# Patient Record
Sex: Male | Born: 1980 | Race: White | Hispanic: No | Marital: Married | Smoking: Former smoker
Health system: Southern US, Community
[De-identification: ages and names within clinical notes are randomized; demographics above are authoritative.]

## PROBLEM LIST (undated history)

## (undated) ENCOUNTER — Emergency Department (HOSPITAL_COMMUNITY): Disposition: A | Discharge status: Home / Self Care | Payer: Self-pay

## (undated) HISTORY — PX: ADENOIDECTOMY: SUR15

## (undated) HISTORY — PX: TYMPANOSTOMY TUBE PLACEMENT: SHX32

## (undated) HISTORY — PX: WISDOM TOOTH EXTRACTION: SHX21

---
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
## 2011-04-16 ENCOUNTER — Ambulatory Visit (INDEPENDENT_AMBULATORY_CARE_PROVIDER_SITE_OTHER): Payer: Self-pay | Admitting: General Surgery

## 2011-04-29 ENCOUNTER — Encounter (INDEPENDENT_AMBULATORY_CARE_PROVIDER_SITE_OTHER): Payer: Self-pay | Admitting: General Surgery

## 2011-04-30 ENCOUNTER — Ambulatory Visit (INDEPENDENT_AMBULATORY_CARE_PROVIDER_SITE_OTHER): Payer: 59 | Admitting: General Surgery

## 2011-04-30 ENCOUNTER — Encounter (INDEPENDENT_AMBULATORY_CARE_PROVIDER_SITE_OTHER): Payer: Self-pay | Admitting: General Surgery

## 2011-04-30 DIAGNOSIS — L0591 Pilonidal cyst without abscess: Secondary | ICD-10-CM | POA: Insufficient documentation

## 2011-04-30 NOTE — Progress Notes (Signed)
Subjective:     Patient ID: Greg Lee, male   DOB: 19-Apr-1980, 31 y.o.   MRN: [id number]  HPI We are asked to see the patient in consultation by Dr. Danelle Earthly Redmon to evaluate him for a pilonidal cyst. The patient is a 31 year old white male who recently developed a swollen tender area near the top of his gluteal cleft region. He went to see his medical doctor who put him on antibiotics. The swelling quickly resolved. He only has some minor tenderness when he leans back and a hard chair at this time. He denies any fever or drainage. This is the first time he hasn't noticed this  Review of Systems  Constitutional: Negative.   HENT: Negative.   Eyes: Negative.   Respiratory: Negative.   Cardiovascular: Negative.   Gastrointestinal: Negative.   Genitourinary: Negative.   Musculoskeletal: Negative.   Skin: Negative.   Neurological: Negative.   Hematological: Negative.   Psychiatric/Behavioral: Negative.        Objective:   Physical Exam  Constitutional: He is oriented to person, place, and time. He appears well-developed and well-nourished.  HENT:  Head: Normocephalic and atraumatic.  Eyes: Conjunctivae and EOM are normal. Pupils are equal, round, and reactive to light.  Neck: Normal range of motion. Neck supple.  Cardiovascular: Normal rate, regular rhythm and normal heart sounds.   Pulmonary/Chest: Effort normal and breath sounds normal.  Abdominal: Soft. Bowel sounds are normal.  Genitourinary:       The patient has a couple of small punctate holes in his gluteal cleft region. No active acute infection today  Musculoskeletal: Normal range of motion.  Neurological: He is alert and oriented to person, place, and time.  Skin: Skin is dry.  Psychiatric: He has a normal mood and affect. His behavior is normal.       Assessment:     The patient has a pilonidal cyst that may have been recently infected.    Plan:     In order to keep this from continuing to recur I think he  would benefit from having this area incised and drained. I have discussed with him in detail the risks and benefits of the operation to this as well some of the technical aspects and he understands and wishes to proceed. He will call back to schedule when it works for him

## 2011-04-30 NOTE — Patient Instructions (Signed)
Plan for incision and drainage of pilonidal cyst

## 2014-02-01 ENCOUNTER — Emergency Department
Admission: EM | Admit: 2014-02-01 | Discharge: 2014-02-01 | Disposition: A | Discharge status: Home / Self Care | Payer: 59 | Source: Home / Self Care | Attending: Physician Assistant | Admitting: Physician Assistant

## 2014-02-01 ENCOUNTER — Encounter: Payer: Self-pay | Admitting: *Deleted

## 2014-02-01 DIAGNOSIS — S233XXA Sprain of ligaments of thoracic spine, initial encounter: Secondary | ICD-10-CM

## 2014-02-01 DIAGNOSIS — S335XXA Sprain of ligaments of lumbar spine, initial encounter: Secondary | ICD-10-CM

## 2014-02-01 DIAGNOSIS — IMO0001 Reserved for inherently not codable concepts without codable children: Secondary | ICD-10-CM

## 2014-02-01 DIAGNOSIS — R030 Elevated blood-pressure reading, without diagnosis of hypertension: Secondary | ICD-10-CM

## 2014-02-01 MED ORDER — CYCLOBENZAPRINE HCL 10 MG PO TABS: ORAL_TABLET | ORAL | Status: DC

## 2014-02-01 MED ORDER — HYDROCODONE-ACETAMINOPHEN 5-325 MG PO TABS: 1.0000 | ORAL_TABLET | Freq: Three times a day (TID) | ORAL | Status: DC | PRN

## 2014-02-01 MED ORDER — MELOXICAM 15 MG PO TABS: ORAL_TABLET | ORAL | Status: DC

## 2014-02-01 NOTE — Discharge Instructions (Signed)
Alternate ice and heat.   Back Exercises These exercises may help you when beginning to rehabilitate your injury. Your symptoms may resolve with or without further involvement from your physician, physical therapist or athletic trainer. While completing these exercises, remember:   Restoring tissue flexibility helps normal motion to return to the joints. This allows healthier, less painful movement and activity.  An effective stretch should be held for at least 30 seconds.  A stretch should never be painful. You should only feel a gentle lengthening or release in the stretched tissue. STRETCH - Extension, Prone on Elbows   Lie on your stomach on the floor, a bed will be too soft. Place your palms about shoulder width apart and at the height of your head.  Place your elbows under your shoulders. If this is too painful, stack pillows under your chest.  Allow your body to relax so that your hips drop lower and make contact more completely with the floor.  Hold this position for __________ seconds.  Slowly return to lying flat on the floor. Repeat __________ times. Complete this exercise __________ times per day.  RANGE OF MOTION - Extension, Prone Press Ups   Lie on your stomach on the floor, a bed will be too soft. Place your palms about shoulder width apart and at the height of your head.  Keeping your back as relaxed as possible, slowly straighten your elbows while keeping your hips on the floor. You may adjust the placement of your hands to maximize your comfort. As you gain motion, your hands will come more underneath your shoulders.  Hold this position __________ seconds.  Slowly return to lying flat on the floor. Repeat __________ times. Complete this exercise __________ times per day.  RANGE OF MOTION- Quadruped, Neutral Spine   Assume a hands and knees position on a firm surface. Keep your hands under your shoulders and your knees under your hips. You may place padding under  your knees for comfort.  Drop your head and point your tail bone toward the ground below you. This will round out your low back like an angry cat. Hold this position for __________ seconds.  Slowly lift your head and release your tail bone so that your back sags into a large arch, like an old horse.  Hold this position for __________ seconds.  Repeat this until you feel limber in your low back.  Now, find your "sweet spot." This will be the most comfortable position somewhere between the two previous positions. This is your neutral spine. Once you have found this position, tense your stomach muscles to support your low back.  Hold this position for __________ seconds. Repeat __________ times. Complete this exercise __________ times per day.  STRETCH - Flexion, Single Knee to Chest   Lie on a firm bed or floor with both legs extended in front of you.  Keeping one leg in contact with the floor, bring your opposite knee to your chest. Hold your leg in place by either grabbing behind your thigh or at your knee.  Pull until you feel a gentle stretch in your low back. Hold __________ seconds.  Slowly release your grasp and repeat the exercise with the opposite side. Repeat __________ times. Complete this exercise __________ times per day.  STRETCH - Hamstrings, Standing  Stand or sit and extend your right / left leg, placing your foot on a chair or foot stool  Keeping a slight arch in your low back and your hips straight forward.  Lead with your chest and lean forward at the waist until you feel a gentle stretch in the back of your right / left knee or thigh. (When done correctly, this exercise requires leaning only a small distance.)  Hold this position for __________ seconds. Repeat __________ times. Complete this stretch __________ times per day. STRENGTHENING - Deep Abdominals, Pelvic Tilt   Lie on a firm bed or floor. Keeping your legs in front of you, bend your knees so they are  both pointed toward the ceiling and your feet are flat on the floor.  Tense your lower abdominal muscles to press your low back into the floor. This motion will rotate your pelvis so that your tail bone is scooping upwards rather than pointing at your feet or into the floor.  With a gentle tension and even breathing, hold this position for __________ seconds. Repeat __________ times. Complete this exercise __________ times per day.  STRENGTHENING - Abdominals, Crunches   Lie on a firm bed or floor. Keeping your legs in front of you, bend your knees so they are both pointed toward the ceiling and your feet are flat on the floor. Cross your arms over your chest.  Slightly tip your chin down without bending your neck.  Tense your abdominals and slowly lift your trunk high enough to just clear your shoulder blades. Lifting higher can put excessive stress on the low back and does not further strengthen your abdominal muscles.  Control your return to the starting position. Repeat __________ times. Complete this exercise __________ times per day.  STRENGTHENING - Quadruped, Opposite UE/LE Lift   Assume a hands and knees position on a firm surface. Keep your hands under your shoulders and your knees under your hips. You may place padding under your knees for comfort.  Find your neutral spine and gently tense your abdominal muscles so that you can maintain this position. Your shoulders and hips should form a rectangle that is parallel with the floor and is not twisted.  Keeping your trunk steady, lift your right hand no higher than your shoulder and then your left leg no higher than your hip. Make sure you are not holding your breath. Hold this position __________ seconds.  Continuing to keep your abdominal muscles tense and your back steady, slowly return to your starting position. Repeat with the opposite arm and leg. Repeat __________ times. Complete this exercise __________ times per  day. Document Released: 03/29/2005 Document Revised: 06/03/2011 Document Reviewed: 06/23/2008 [facility] Patient Information 2015 [employer] [place], [place]. This information is not intended to replace advice given to you by your health care provider. Make sure you discuss any questions you have with your health care provider.

## 2014-02-01 NOTE — ED Provider Notes (Signed)
CSN: [id number]     Arrival date & time 02/01/14  1722 History   First MD Initiated Contact with Patient 02/01/14 1731     Chief Complaint  Patient presents with  . Back Pain   (Consider location/radiation/quality/duration/timing/severity/associated sxs/prior Treatment) HPI  Pt is a 33 yo male who presents to the clinic with mid to low left sided back pain that started this morning after lifting weights. Has continued to get worse throughout the day. Pain currently 7-8/10. Sitting still no pain. He admits to doing a new overhead lift this morning with 85lb weights. Not taken anything to make better. All movement causes pain.   Denies any bowel or bladder dysfunction or saddle anesthesia.   Past Medical History  Diagnosis Date  . Pilonidal cyst    Past Surgical History  Procedure Laterality Date  . Adenoidectomy    . Wisdom tooth extraction    . Tympanostomy tube placement     Family History  Problem Relation Age of Onset  . Cancer Maternal Grandmother     colon  . Heart disease Maternal Grandfather   . Hypertension Mother   . Hypertension Father   . Diabetes Father    History  Substance Use Topics  . Smoking status: Former Smoker -- 0.00 packs/day  . Smokeless tobacco: Not on file  . Alcohol Use: Yes     Comment: 1 q wk    Review of Systems  All other systems reviewed and are negative.   Allergies  Review of patient's allergies indicates no known allergies.  Home Medications   Prior to Admission medications   Medication Sig Start Date End Date Taking? Authorizing Provider  cyclobenzaprine (FLEXERIL) 10 MG tablet Start one tablet at bedtime and can increase up to three times a day. 02/01/14   Delayne Sanzo L Zannie Locastro, PA-C  HYDROcodone-acetaminophen (NORCO/VICODIN) 5-325 MG per tablet Take 1 tablet by mouth every 8 (eight) hours as needed for moderate pain. 02/01/14   Omarion Minnehan L Laticia Vannostrand, PA-C  meloxicam (MOBIC) 15 MG tablet One tab PO qAM with breakfast for 2 weeks, then  daily prn pain. 02/01/14   Zaydon Kinser L Ervin Rothbauer, PA-C  Multiple Vitamins-Minerals (ALIVE MENS ENERGY PO) Take by mouth daily.    Historical Provider, MD   BP 170/104 mmHg  Pulse 100  Temp(Src) 98.1 F (36.7 C) (Oral)  Resp 18  Ht 5\' 9"  (1.753 m)  Wt 225 lb (102.059 kg)  BMI 33.21 kg/m2  SpO2 98% Physical Exam  Constitutional: He appears well-developed and well-nourished.  Musculoskeletal:  Left sided Paraspinous muscles are tight to palpation. With deep palpation there is mild pain but pt is a bigger guy and harder to palpate muscles.  Pain comes with lateral movements at waist, deep breathing, bending over.  Pt has no ROM at waist due to pain.  Pain seems to concentrate in the rhomboid and latissimus dorsi on left side.  No pain to palpation over spine.      ED Course  Procedures (including critical care time) Labs Review Labs Reviewed - No data to display  Imaging Review No results found.   MDM   1. Elevated blood pressure   2. Sprain of upper back, initial encounter   3. Sprain of low back, initial encounter    Discussed BP elevation could be due to pain but suggest to have follow BP check with PCP.   Discussed with patient symptoms are very consistent with muscle spasms of mid to low left side of back.  Offered  shot of toradol pt declined shot.  I do not think imaging is warranted at this time.  mobic given to take every morning with breakfast.  Flexeril was given to use up to 3 times a day. Sedation warning given.  norco for acute pain.  Exercises to start.  Encouraged ice 15min at a time alternated with heat.  Discussed can use topical and consider massage.  Written out of work for shift Wednesday and Thursday. He has to wear 25lbs belt and do a lot of lifting on his job.  Discussed follow up with PCP if not improving in next week.  Encouraged pt to keep moving do not stay stiff.     Jomarie LongsJade L Foch Rosenwald, PA-C 02/01/14 1920

## 2014-02-01 NOTE — ED Notes (Signed)
Pt c/o LT side mid back pain x this morning while lifting weights. No OTC meds.

## 2016-05-15 DIAGNOSIS — M5136 Other intervertebral disc degeneration, lumbar region: Secondary | ICD-10-CM | POA: Diagnosis not present

## 2016-05-15 DIAGNOSIS — M9903 Segmental and somatic dysfunction of lumbar region: Secondary | ICD-10-CM | POA: Diagnosis not present

## 2016-05-15 DIAGNOSIS — M5441 Lumbago with sciatica, right side: Secondary | ICD-10-CM | POA: Diagnosis not present

## 2016-05-20 DIAGNOSIS — M9903 Segmental and somatic dysfunction of lumbar region: Secondary | ICD-10-CM | POA: Diagnosis not present

## 2016-05-20 DIAGNOSIS — M5136 Other intervertebral disc degeneration, lumbar region: Secondary | ICD-10-CM | POA: Diagnosis not present

## 2016-05-20 DIAGNOSIS — M5441 Lumbago with sciatica, right side: Secondary | ICD-10-CM | POA: Diagnosis not present

## 2016-05-21 DIAGNOSIS — M5441 Lumbago with sciatica, right side: Secondary | ICD-10-CM | POA: Diagnosis not present

## 2016-05-21 DIAGNOSIS — M5136 Other intervertebral disc degeneration, lumbar region: Secondary | ICD-10-CM | POA: Diagnosis not present

## 2016-05-21 DIAGNOSIS — M9903 Segmental and somatic dysfunction of lumbar region: Secondary | ICD-10-CM | POA: Diagnosis not present

## 2016-05-23 DIAGNOSIS — M5136 Other intervertebral disc degeneration, lumbar region: Secondary | ICD-10-CM | POA: Diagnosis not present

## 2016-05-23 DIAGNOSIS — M9903 Segmental and somatic dysfunction of lumbar region: Secondary | ICD-10-CM | POA: Diagnosis not present

## 2016-05-23 DIAGNOSIS — M5441 Lumbago with sciatica, right side: Secondary | ICD-10-CM | POA: Diagnosis not present

## 2016-05-27 DIAGNOSIS — M5441 Lumbago with sciatica, right side: Secondary | ICD-10-CM | POA: Diagnosis not present

## 2016-05-27 DIAGNOSIS — M5136 Other intervertebral disc degeneration, lumbar region: Secondary | ICD-10-CM | POA: Diagnosis not present

## 2016-05-27 DIAGNOSIS — M9903 Segmental and somatic dysfunction of lumbar region: Secondary | ICD-10-CM | POA: Diagnosis not present

## 2016-05-28 DIAGNOSIS — M9903 Segmental and somatic dysfunction of lumbar region: Secondary | ICD-10-CM | POA: Diagnosis not present

## 2016-05-28 DIAGNOSIS — M5136 Other intervertebral disc degeneration, lumbar region: Secondary | ICD-10-CM | POA: Diagnosis not present

## 2016-05-28 DIAGNOSIS — M5441 Lumbago with sciatica, right side: Secondary | ICD-10-CM | POA: Diagnosis not present

## 2016-05-30 DIAGNOSIS — M5441 Lumbago with sciatica, right side: Secondary | ICD-10-CM | POA: Diagnosis not present

## 2016-05-30 DIAGNOSIS — M5136 Other intervertebral disc degeneration, lumbar region: Secondary | ICD-10-CM | POA: Diagnosis not present

## 2016-05-30 DIAGNOSIS — M9903 Segmental and somatic dysfunction of lumbar region: Secondary | ICD-10-CM | POA: Diagnosis not present

## 2016-06-04 DIAGNOSIS — M5136 Other intervertebral disc degeneration, lumbar region: Secondary | ICD-10-CM | POA: Diagnosis not present

## 2016-06-04 DIAGNOSIS — M5441 Lumbago with sciatica, right side: Secondary | ICD-10-CM | POA: Diagnosis not present

## 2016-06-04 DIAGNOSIS — M9903 Segmental and somatic dysfunction of lumbar region: Secondary | ICD-10-CM | POA: Diagnosis not present

## 2016-06-05 DIAGNOSIS — M9903 Segmental and somatic dysfunction of lumbar region: Secondary | ICD-10-CM | POA: Diagnosis not present

## 2016-06-05 DIAGNOSIS — M5136 Other intervertebral disc degeneration, lumbar region: Secondary | ICD-10-CM | POA: Diagnosis not present

## 2016-06-05 DIAGNOSIS — M5441 Lumbago with sciatica, right side: Secondary | ICD-10-CM | POA: Diagnosis not present

## 2016-06-06 DIAGNOSIS — M9903 Segmental and somatic dysfunction of lumbar region: Secondary | ICD-10-CM | POA: Diagnosis not present

## 2016-06-06 DIAGNOSIS — M5136 Other intervertebral disc degeneration, lumbar region: Secondary | ICD-10-CM | POA: Diagnosis not present

## 2016-06-06 DIAGNOSIS — M5441 Lumbago with sciatica, right side: Secondary | ICD-10-CM | POA: Diagnosis not present

## 2016-06-13 DIAGNOSIS — M9903 Segmental and somatic dysfunction of lumbar region: Secondary | ICD-10-CM | POA: Diagnosis not present

## 2016-06-13 DIAGNOSIS — M5136 Other intervertebral disc degeneration, lumbar region: Secondary | ICD-10-CM | POA: Diagnosis not present

## 2016-06-13 DIAGNOSIS — M5441 Lumbago with sciatica, right side: Secondary | ICD-10-CM | POA: Diagnosis not present

## 2016-06-17 DIAGNOSIS — M5136 Other intervertebral disc degeneration, lumbar region: Secondary | ICD-10-CM | POA: Diagnosis not present

## 2016-06-17 DIAGNOSIS — M5441 Lumbago with sciatica, right side: Secondary | ICD-10-CM | POA: Diagnosis not present

## 2016-06-17 DIAGNOSIS — M9903 Segmental and somatic dysfunction of lumbar region: Secondary | ICD-10-CM | POA: Diagnosis not present

## 2016-06-25 DIAGNOSIS — M9903 Segmental and somatic dysfunction of lumbar region: Secondary | ICD-10-CM | POA: Diagnosis not present

## 2016-06-25 DIAGNOSIS — M5441 Lumbago with sciatica, right side: Secondary | ICD-10-CM | POA: Diagnosis not present

## 2016-06-25 DIAGNOSIS — M5136 Other intervertebral disc degeneration, lumbar region: Secondary | ICD-10-CM | POA: Diagnosis not present

## 2017-05-20 DIAGNOSIS — M9903 Segmental and somatic dysfunction of lumbar region: Secondary | ICD-10-CM | POA: Diagnosis not present

## 2017-05-26 DIAGNOSIS — M9903 Segmental and somatic dysfunction of lumbar region: Secondary | ICD-10-CM | POA: Diagnosis not present

## 2017-06-02 DIAGNOSIS — M9903 Segmental and somatic dysfunction of lumbar region: Secondary | ICD-10-CM | POA: Diagnosis not present

## 2017-06-09 DIAGNOSIS — M9903 Segmental and somatic dysfunction of lumbar region: Secondary | ICD-10-CM | POA: Diagnosis not present

## 2017-06-16 DIAGNOSIS — M9903 Segmental and somatic dysfunction of lumbar region: Secondary | ICD-10-CM | POA: Diagnosis not present

## 2017-06-23 DIAGNOSIS — M9903 Segmental and somatic dysfunction of lumbar region: Secondary | ICD-10-CM | POA: Diagnosis not present

## 2017-07-10 DIAGNOSIS — M9903 Segmental and somatic dysfunction of lumbar region: Secondary | ICD-10-CM | POA: Diagnosis not present

## 2017-07-29 DIAGNOSIS — M9903 Segmental and somatic dysfunction of lumbar region: Secondary | ICD-10-CM | POA: Diagnosis not present

## 2017-08-12 DIAGNOSIS — M9903 Segmental and somatic dysfunction of lumbar region: Secondary | ICD-10-CM | POA: Diagnosis not present

## 2018-04-20 DIAGNOSIS — M79641 Pain in right hand: Secondary | ICD-10-CM | POA: Diagnosis not present

## 2018-04-20 DIAGNOSIS — S62612A Displaced fracture of proximal phalanx of right middle finger, initial encounter for closed fracture: Secondary | ICD-10-CM | POA: Diagnosis not present

## 2018-04-27 DIAGNOSIS — S62612A Displaced fracture of proximal phalanx of right middle finger, initial encounter for closed fracture: Secondary | ICD-10-CM | POA: Diagnosis not present

## 2018-05-11 DIAGNOSIS — S62612D Displaced fracture of proximal phalanx of right middle finger, subsequent encounter for fracture with routine healing: Secondary | ICD-10-CM | POA: Diagnosis not present

## 2018-06-01 DIAGNOSIS — S62612D Displaced fracture of proximal phalanx of right middle finger, subsequent encounter for fracture with routine healing: Secondary | ICD-10-CM | POA: Diagnosis not present

## 2018-06-01 DIAGNOSIS — M79641 Pain in right hand: Secondary | ICD-10-CM | POA: Diagnosis not present

## 2018-06-30 DIAGNOSIS — M9903 Segmental and somatic dysfunction of lumbar region: Secondary | ICD-10-CM | POA: Diagnosis not present

## 2018-07-28 DIAGNOSIS — M9903 Segmental and somatic dysfunction of lumbar region: Secondary | ICD-10-CM | POA: Diagnosis not present

## 2019-06-14 ENCOUNTER — Emergency Department (HOSPITAL_COMMUNITY): Payer: 59

## 2019-06-14 ENCOUNTER — Observation Stay (HOSPITAL_COMMUNITY)
Admission: EM | Admit: 2019-06-14 | Discharge: 2019-06-15 | Disposition: A | Discharge status: Home / Self Care | Payer: 59 | Attending: Orthopedic Surgery | Admitting: Orthopedic Surgery

## 2019-06-14 ENCOUNTER — Encounter (HOSPITAL_COMMUNITY): Payer: Self-pay | Admitting: Orthopedic Surgery

## 2019-06-14 ENCOUNTER — Other Ambulatory Visit: Payer: Self-pay

## 2019-06-14 DIAGNOSIS — Z419 Encounter for procedure for purposes other than remedying health state, unspecified: Secondary | ICD-10-CM

## 2019-06-14 DIAGNOSIS — S82892A Other fracture of left lower leg, initial encounter for closed fracture: Secondary | ICD-10-CM | POA: Diagnosis present

## 2019-06-14 DIAGNOSIS — S82852A Displaced trimalleolar fracture of left lower leg, initial encounter for closed fracture: Principal | ICD-10-CM | POA: Insufficient documentation

## 2019-06-14 DIAGNOSIS — W19XXXA Unspecified fall, initial encounter: Secondary | ICD-10-CM

## 2019-06-14 DIAGNOSIS — F17220 Nicotine dependence, chewing tobacco, uncomplicated: Secondary | ICD-10-CM | POA: Insufficient documentation

## 2019-06-14 DIAGNOSIS — Z20822 Contact with and (suspected) exposure to covid-19: Secondary | ICD-10-CM | POA: Diagnosis not present

## 2019-06-14 LAB — BASIC METABOLIC PANEL
Anion gap: 10 (ref 5–15)
BUN: 15 mg/dL (ref 6–20)
CO2: 25 mmol/L (ref 22–32)
Calcium: 8.9 mg/dL (ref 8.9–10.3)
Chloride: 103 mmol/L (ref 98–111)
Creatinine, Ser: 1.02 mg/dL (ref 0.61–1.24)
GFR calc Af Amer: >60 mL/min (ref >60)
GFR calc non Af Amer: >60 mL/min (ref >60)
Glucose, Bld: 104 mg/dL — ABNORMAL HIGH (ref 70–99)
Potassium: 3.9 mmol/L (ref 3.5–5.1)
Sodium: 138 mmol/L (ref 135–145)

## 2019-06-14 LAB — CBC
HCT: 39.6 % (ref 39.0–52.0)
Hemoglobin: 12.6 g/dL — ABNORMAL LOW (ref 13.0–17.0)
MCH: 26.8 pg (ref 26.0–34.0)
MCHC: 31.8 g/dL (ref 30.0–36.0)
MCV: 84.3 fL (ref 80.0–100.0)
Platelets: 205 10*3/uL (ref 150–400)
RBC: 4.7 MIL/uL (ref 4.22–5.81)
RDW: 12.5 % (ref 11.5–15.5)
WBC: 10.1 10*3/uL (ref 4.0–10.5)
nRBC: 0 % (ref 0.0–0.2)

## 2019-06-14 LAB — SURGICAL PCR SCREEN
MRSA, PCR: NEGATIVE
Staphylococcus aureus: NEGATIVE

## 2019-06-14 LAB — RESPIRATORY PANEL BY RT PCR (FLU A&B, COVID)
Influenza A by PCR: NEGATIVE
Influenza B by PCR: NEGATIVE
SARS Coronavirus 2 by RT PCR: NEGATIVE

## 2019-06-14 MED ORDER — ACETAMINOPHEN 325 MG PO TABS: 325.0000 mg | ORAL_TABLET | Freq: Four times a day (QID) | ORAL | Status: DC | PRN

## 2019-06-14 MED ORDER — METHOCARBAMOL 1000 MG/10ML IJ SOLN
500.0000 mg | Freq: Four times a day (QID) | INTRAVENOUS | Status: DC | PRN
  Filled 2019-06-14: qty 5

## 2019-06-14 MED ORDER — PROPOFOL 10 MG/ML IV BOLUS
INTRAVENOUS | Status: AC | PRN
  Administered 2019-06-14: 50 mg via INTRAVENOUS
  Administered 2019-06-14: 30 mg via INTRAVENOUS
  Administered 2019-06-14: 50 mg via INTRAVENOUS

## 2019-06-14 MED ORDER — METHOCARBAMOL 1000 MG/10ML IJ SOLN
1000.0000 mg | Freq: Once | INTRAMUSCULAR | Status: AC
  Administered 2019-06-14: 19:00:00 1000 mg via INTRAVENOUS
  Filled 2019-06-14: qty 10

## 2019-06-14 MED ORDER — CELECOXIB 200 MG PO CAPS
400.0000 mg | ORAL_CAPSULE | Freq: Once | ORAL | Status: AC
  Administered 2019-06-15: 04:00:00 400 mg via ORAL
  Filled 2019-06-14: qty 2
  Filled 2019-06-14: qty 1

## 2019-06-14 MED ORDER — FENTANYL CITRATE (PF) 100 MCG/2ML IJ SOLN
100.0000 ug | Freq: Once | INTRAMUSCULAR | Status: AC
  Administered 2019-06-14: 18:00:00 100 ug via INTRAVENOUS
  Filled 2019-06-14: qty 2

## 2019-06-14 MED ORDER — HYDROMORPHONE HCL 1 MG/ML IJ SOLN
1.0000 mg | Freq: Once | INTRAMUSCULAR | Status: AC
  Administered 2019-06-14: 16:00:00 1 mg via INTRAVENOUS
  Filled 2019-06-14: qty 1

## 2019-06-14 MED ORDER — PROPOFOL 10 MG/ML IV BOLUS
50.0000 mg | Freq: Once | INTRAVENOUS | Status: AC
  Administered 2019-06-14: 18:00:00 50 mg via INTRAVENOUS
  Filled 2019-06-14: qty 20

## 2019-06-14 MED ORDER — ASPIRIN 81 MG PO CHEW
81.0000 mg | CHEWABLE_TABLET | Freq: Two times a day (BID) | ORAL | Status: DC
  Administered 2019-06-14: 81 mg via ORAL
  Filled 2019-06-14: qty 1

## 2019-06-14 MED ORDER — OXYCODONE HCL 5 MG PO TABS
5.0000 mg | ORAL_TABLET | ORAL | Status: DC | PRN
  Administered 2019-06-14 – 2019-06-15 (×2): 10 mg via ORAL
  Filled 2019-06-14 (×2): qty 2

## 2019-06-14 MED ORDER — CEFAZOLIN SODIUM-DEXTROSE 2-4 GM/100ML-% IV SOLN
2.0000 g | INTRAVENOUS | Status: AC
  Administered 2019-06-15: 2 g via INTRAVENOUS
  Filled 2019-06-14: qty 100

## 2019-06-14 MED ORDER — POVIDONE-IODINE 10 % EX SWAB
2.0000 "application " | Freq: Once | CUTANEOUS | Status: AC
  Administered 2019-06-14: 2 via TOPICAL

## 2019-06-14 MED ORDER — CHLORHEXIDINE GLUCONATE 4 % EX LIQD
60.0000 mL | Freq: Once | CUTANEOUS | Status: AC
  Administered 2019-06-14: 4 via TOPICAL
  Filled 2019-06-14: qty 60

## 2019-06-14 MED ORDER — SODIUM CHLORIDE 0.9 % IV SOLN
INTRAVENOUS | Status: AC | PRN
  Administered 2019-06-14: 100 mL/h via INTRAVENOUS

## 2019-06-14 MED ORDER — MUPIROCIN 2 % EX OINT
1.0000 "application " | TOPICAL_OINTMENT | Freq: Two times a day (BID) | CUTANEOUS | Status: DC
  Administered 2019-06-14 – 2019-06-15 (×2): 1 via NASAL
  Filled 2019-06-14 (×2): qty 22

## 2019-06-14 MED ORDER — METHOCARBAMOL 500 MG PO TABS
500.0000 mg | ORAL_TABLET | Freq: Four times a day (QID) | ORAL | Status: DC | PRN
  Administered 2019-06-15: 06:00:00 500 mg via ORAL
  Filled 2019-06-14: qty 1

## 2019-06-14 MED ORDER — HYDROMORPHONE HCL 1 MG/ML IJ SOLN: 0.5000 mg | INTRAMUSCULAR | Status: DC | PRN

## 2019-06-14 NOTE — ED Provider Notes (Signed)
  Physical Exam  BP 137/84 (BP Location: Right Arm)   Pulse 66   Temp 98.3 F (36.8 C) (Oral)   Resp 18   Wt 93.4 kg   SpO2 99%   BMI 30.42 kg/m   Physical Exam  ED Course/Procedures     .Sedation  Date/Time: 06/14/2019 11:40 PM Performed by: Benjiman Core, MD Authorized by: Benjiman Core, MD   Consent:    Consent obtained:  Verbal and written   Consent given by:  Patient   Risks discussed:  Allergic reaction, dysrhythmia, inadequate sedation, nausea, vomiting, respiratory compromise necessitating ventilatory assistance and intubation and prolonged hypoxia resulting in organ damage   Alternatives discussed:  Analgesia without sedation and anxiolysis Universal protocol:    Immediately prior to procedure a time out was called: yes     Patient identity confirmation method:  Arm band and verbally with patient Indications:    Procedure performed:  Dislocation reduction   Procedure necessitating sedation performed by:  Physician performing sedation Pre-sedation assessment:    Time since last food or drink:  2hrs   ASA classification: class 1 - normal, healthy patient     Neck mobility: normal     Mouth opening:  2 finger widths   Thyromental distance:  3 finger widths   Mallampati score:  III - soft palate, base of uvula visible   Pre-sedation assessments completed and reviewed: airway patency, cardiovascular function, hydration status, mental status, nausea/vomiting and pain level     Pre-sedation assessment completed:  06/14/2019 5:30 PM Immediate pre-procedure details:    Reassessment: Patient reassessed immediately prior to procedure     Reviewed: vital signs     Verified: bag valve mask available and emergency equipment available   Procedure details (see MAR for exact dosages):    Preoxygenation:  Nasal cannula   Sedation:  Propofol   Intended level of sedation: deep   Intra-procedure monitoring:  Blood pressure monitoring, cardiac monitor, continuous pulse  oximetry, frequent LOC assessments and frequent vital sign checks   Intra-procedure events: none     Total Provider sedation time (minutes):  10 Post-procedure details:    Attendance: Constant attendance by certified staff until patient recovered     Recovery: Patient returned to pre-procedure baseline     Patient is stable for discharge or admission: yes     Patient tolerance:  Tolerated well, no immediate complications    MDM         Benjiman Core, MD 06/14/19 2342

## 2019-06-14 NOTE — ED Notes (Signed)
Greg Lee wife [phone] looking for an update

## 2019-06-14 NOTE — Progress Notes (Signed)
Orthopedic Tech Progress Note Patient Details:  Greg Lee Jan 06, 1981 [id number] Doctor did ankle reduction and needed ortho Ortho Devices Type of Ortho Device: Short leg splint, Stirrup splint Ortho Device/Splint Location: LLE Ortho Device/Splint Interventions: Application   Post Interventions Patient Tolerated: Well Instructions Provided: Care of device   [name] 06/14/2019, 5:44 PM

## 2019-06-14 NOTE — ED Notes (Signed)
Pt's wife Tamera Punt updated on pt status.
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
## 2019-06-15 ENCOUNTER — Encounter: Payer: Self-pay | Admitting: Orthopedic Surgery

## 2019-06-15 ENCOUNTER — Inpatient Hospital Stay (HOSPITAL_COMMUNITY): Payer: 59

## 2019-06-15 ENCOUNTER — Inpatient Hospital Stay (HOSPITAL_COMMUNITY): Payer: 59 | Admitting: Anesthesiology

## 2019-06-15 ENCOUNTER — Encounter (HOSPITAL_COMMUNITY): Payer: Self-pay | Admitting: Orthopedic Surgery

## 2019-06-15 ENCOUNTER — Encounter (HOSPITAL_COMMUNITY)
Admission: EM | Disposition: A | Discharge status: Home / Self Care | Payer: Self-pay | Source: Home / Self Care | Attending: Emergency Medicine

## 2019-06-15 DIAGNOSIS — S82892A Other fracture of left lower leg, initial encounter for closed fracture: Secondary | ICD-10-CM

## 2019-06-15 HISTORY — PX: ORIF ANKLE FRACTURE: SHX5408

## 2019-06-15 LAB — HIV ANTIBODY (ROUTINE TESTING W REFLEX): HIV Screen 4th Generation wRfx: NONREACTIVE

## 2019-06-15 SURGERY — OPEN REDUCTION INTERNAL FIXATION (ORIF) ANKLE FRACTURE
Anesthesia: General | Site: Ankle | Laterality: Left

## 2019-06-15 MED ORDER — ONDANSETRON HCL 4 MG/2ML IJ SOLN
INTRAMUSCULAR | Status: DC | PRN
  Administered 2019-06-15: 4 mg via INTRAVENOUS

## 2019-06-15 MED ORDER — MIDAZOLAM HCL 2 MG/2ML IJ SOLN
INTRAMUSCULAR | Status: AC
  Filled 2019-06-15: qty 2

## 2019-06-15 MED ORDER — FENTANYL CITRATE (PF) 100 MCG/2ML IJ SOLN: 100.0000 ug | Freq: Once | INTRAMUSCULAR | Status: AC

## 2019-06-15 MED ORDER — STERILE WATER FOR IRRIGATION IR SOLN
Status: DC | PRN
  Administered 2019-06-15: 1000 mL

## 2019-06-15 MED ORDER — MIDAZOLAM HCL 2 MG/2ML IJ SOLN: 2.0000 mg | Freq: Once | INTRAMUSCULAR | Status: AC

## 2019-06-15 MED ORDER — DEXAMETHASONE SODIUM PHOSPHATE 10 MG/ML IJ SOLN
INTRAMUSCULAR | Status: DC | PRN
  Administered 2019-06-15: 5 mg

## 2019-06-15 MED ORDER — ASPIRIN EC 81 MG PO TBEC: 81.0000 mg | DELAYED_RELEASE_TABLET | Freq: Two times a day (BID) | ORAL | 0 refills | Status: AC

## 2019-06-15 MED ORDER — ROPIVACAINE HCL 5 MG/ML IJ SOLN
INTRAMUSCULAR | Status: DC | PRN
  Administered 2019-06-15: 20 mL via PERINEURAL
  Administered 2019-06-15: 30 mL via PERINEURAL

## 2019-06-15 MED ORDER — PHENYLEPHRINE HCL-NACL 10-0.9 MG/250ML-% IV SOLN
INTRAVENOUS | Status: DC | PRN
  Administered 2019-06-15: 25 ug/min via INTRAVENOUS

## 2019-06-15 MED ORDER — LACTATED RINGERS IV SOLN: INTRAVENOUS | Status: DC

## 2019-06-15 MED ORDER — FENTANYL CITRATE (PF) 100 MCG/2ML IJ SOLN
INTRAMUSCULAR | Status: AC
  Administered 2019-06-15: 12:00:00 100 ug via INTRAVENOUS
  Filled 2019-06-15: qty 2

## 2019-06-15 MED ORDER — MIDAZOLAM HCL 2 MG/2ML IJ SOLN
INTRAMUSCULAR | Status: AC
  Administered 2019-06-15: 12:00:00 2 mg via INTRAVENOUS
  Filled 2019-06-15: qty 2

## 2019-06-15 MED ORDER — FENTANYL CITRATE (PF) 250 MCG/5ML IJ SOLN
INTRAMUSCULAR | Status: AC
  Filled 2019-06-15: qty 5

## 2019-06-15 MED ORDER — PHENYLEPHRINE HCL (PRESSORS) 10 MG/ML IV SOLN
INTRAVENOUS | Status: DC | PRN
  Administered 2019-06-15: 80 ug via INTRAVENOUS

## 2019-06-15 MED ORDER — SUGAMMADEX SODIUM 200 MG/2ML IV SOLN
INTRAVENOUS | Status: DC | PRN
  Administered 2019-06-15: 180 mg via INTRAVENOUS

## 2019-06-15 MED ORDER — 0.9 % SODIUM CHLORIDE (POUR BTL) OPTIME
TOPICAL | Status: DC | PRN
  Administered 2019-06-15 (×4): 1000 mL

## 2019-06-15 MED ORDER — METHOCARBAMOL 500 MG PO TABS: 500.0000 mg | ORAL_TABLET | Freq: Three times a day (TID) | ORAL | 0 refills | Status: AC | PRN

## 2019-06-15 MED ORDER — PROPOFOL 10 MG/ML IV BOLUS
INTRAVENOUS | Status: DC | PRN
  Administered 2019-06-15: 200 mg via INTRAVENOUS

## 2019-06-15 MED ORDER — LACTATED RINGERS IV SOLN: INTRAVENOUS | Status: DC | PRN

## 2019-06-15 MED ORDER — BUPIVACAINE HCL (PF) 0.25 % IJ SOLN
INTRAMUSCULAR | Status: AC
  Filled 2019-06-15: qty 30

## 2019-06-15 MED ORDER — LIDOCAINE 2% (20 MG/ML) 5 ML SYRINGE
INTRAMUSCULAR | Status: DC | PRN
  Administered 2019-06-15: 60 mg via INTRAVENOUS

## 2019-06-15 MED ORDER — PROPOFOL 10 MG/ML IV BOLUS
INTRAVENOUS | Status: AC
  Filled 2019-06-15: qty 20

## 2019-06-15 MED ORDER — DEXAMETHASONE SODIUM PHOSPHATE 10 MG/ML IJ SOLN
INTRAMUSCULAR | Status: DC | PRN
  Administered 2019-06-15: 5 mg via INTRAVENOUS

## 2019-06-15 MED ORDER — ROCURONIUM BROMIDE 50 MG/5ML IV SOSY
PREFILLED_SYRINGE | INTRAVENOUS | Status: DC | PRN
  Administered 2019-06-15: 60 mg via INTRAVENOUS

## 2019-06-15 MED ORDER — OXYCODONE HCL 5 MG PO TABS: 5.0000 mg | ORAL_TABLET | ORAL | 0 refills | Status: AC | PRN

## 2019-06-15 SURGICAL SUPPLY — 85 items
BIT DRILL 110X2.5XQCK CNCT (BIT) IMPLANT
BIT DRILL 2.5 (BIT) ×2
BIT DRILL 2.7XCANN QCK CNCT (BIT) IMPLANT
BIT DRILL CANN 2.7 (BIT) ×2
BIT DRILL QC 110 3.5 (BIT) ×1
BIT DRILL QC 110 3.5MM (BIT) IMPLANT
BIT DRILL STD 2.0MM (DRILL) IMPLANT
BIT DRL 110X2.5XQCK CNCT (BIT) ×1
BIT DRL 2.7XCANN QCK CNCT (BIT) ×1
BNDG COHESIVE 4X5 TAN STRL (GAUZE/BANDAGES/DRESSINGS) ×1 IMPLANT
BNDG COHESIVE 6X5 TAN STRL LF (GAUZE/BANDAGES/DRESSINGS) IMPLANT
BNDG ELASTIC 4X5.8 VLCR STR LF (GAUZE/BANDAGES/DRESSINGS) ×2 IMPLANT
BNDG ELASTIC 6X10 VLCR STRL LF (GAUZE/BANDAGES/DRESSINGS) ×1 IMPLANT
BNDG ESMARK 4X9 LF (GAUZE/BANDAGES/DRESSINGS) ×2 IMPLANT
BNDG GAUZE ELAST 4 BULKY (GAUZE/BANDAGES/DRESSINGS) ×1 IMPLANT
COVER SURGICAL LIGHT HANDLE (MISCELLANEOUS) ×2 IMPLANT
CUFF TOURN SGL QUICK 34 (TOURNIQUET CUFF)
CUFF TOURN SGL QUICK 42 (TOURNIQUET CUFF) IMPLANT
CUFF TRNQT CYL 34X4.125X (TOURNIQUET CUFF) IMPLANT
DRAPE C-ARM 42X72 X-RAY (DRAPES) ×2 IMPLANT
DRAPE INCISE IOBAN 66X45 STRL (DRAPES) ×1 IMPLANT
DRAPE SURG 17X23 STRL (DRAPES) ×2 IMPLANT
DRAPE U-SHAPE 47X51 STRL (DRAPES) ×2 IMPLANT
DRILL BIT QC 110 3.5MM (BIT) ×2
DRILL STANDARD 2.0MM (DRILL) ×2
DRSG AQUACEL AG ADV 3.5X 4 (GAUZE/BANDAGES/DRESSINGS) ×1 IMPLANT
DRSG AQUACEL AG ADV 3.5X10 (GAUZE/BANDAGES/DRESSINGS) ×1 IMPLANT
DRSG PAD ABDOMINAL 8X10 ST (GAUZE/BANDAGES/DRESSINGS) ×2 IMPLANT
DURAPREP 26ML APPLICATOR (WOUND CARE) ×1 IMPLANT
ELECT CAUTERY BLADE 6.4 (BLADE) ×2 IMPLANT
ELECT REM PT RETURN 9FT ADLT (ELECTROSURGICAL) ×2
ELECTRODE REM PT RTRN 9FT ADLT (ELECTROSURGICAL) ×1 IMPLANT
GAUZE SPONGE 4X4 12PLY STRL (GAUZE/BANDAGES/DRESSINGS) ×2 IMPLANT
GAUZE XEROFORM 5X9 LF (GAUZE/BANDAGES/DRESSINGS) ×1 IMPLANT
GLOVE BIOGEL PI IND STRL 7.0 (GLOVE) ×1 IMPLANT
GLOVE BIOGEL PI IND STRL 8 (GLOVE) ×1 IMPLANT
GLOVE BIOGEL PI INDICATOR 7.0 (GLOVE) ×1
GLOVE BIOGEL PI INDICATOR 8 (GLOVE) ×1
GLOVE ECLIPSE 7.0 STRL STRAW (GLOVE) ×2 IMPLANT
GLOVE ECLIPSE 8.0 STRL XLNG CF (GLOVE) ×2 IMPLANT
GOWN STRL REUS W/ TWL LRG LVL3 (GOWN DISPOSABLE) ×3 IMPLANT
GOWN STRL REUS W/TWL LRG LVL3 (GOWN DISPOSABLE) ×6
HANDPIECE INTERPULSE COAX TIP (DISPOSABLE)
K-WIRE ACE 1.6X6 (WIRE) ×2
KIT BASIN OR (CUSTOM PROCEDURE TRAY) ×2 IMPLANT
KIT TURNOVER KIT B (KITS) ×2 IMPLANT
KWIRE ACE 1.6X6 (WIRE) IMPLANT
MANIFOLD NEPTUNE II (INSTRUMENTS) ×2 IMPLANT
NDL HYPO 25GX1X1/2 BEV (NEEDLE) ×1 IMPLANT
NEEDLE HYPO 25GX1X1/2 BEV (NEEDLE) ×2 IMPLANT
NS IRRIG 1000ML POUR BTL (IV SOLUTION) ×2 IMPLANT
PACK ORTHO EXTREMITY (CUSTOM PROCEDURE TRAY) ×2 IMPLANT
PAD ARMBOARD 7.5X6 YLW CONV (MISCELLANEOUS) ×4 IMPLANT
PAD CAST 4YDX4 CTTN HI CHSV (CAST SUPPLIES) ×2 IMPLANT
PADDING CAST COTTON 4X4 STRL (CAST SUPPLIES) ×2
PLATE LOCK DIST FIB 2.7X80 LT (Plate) ×1 IMPLANT
SCREW 2.7X16MM (Screw) ×2 IMPLANT
SCREW 3.5X16MM (Screw) ×4 IMPLANT
SCREW BN 2.7X16X3.5XST NS (Screw) IMPLANT
SCREW CANN 1/3 THRD RVRS CT (Screw) IMPLANT
SCREW CANNULATED 4.0X40 (Screw) ×2 IMPLANT
SCREW CORTICAL 3.5X18 (Screw) ×1 IMPLANT
SCREW CORTICAL 3.5X26 (Screw) ×1 IMPLANT
SCREW LOCK 10X2.7XNS ELB (Screw) IMPLANT
SCREW LOCK 14X2.7X NS (Screw) IMPLANT
SCREW LOCK 16X2.7X (Screw) IMPLANT
SCREW LOCKING 2.7X10MM (Screw) ×2 IMPLANT
SCREW LOCKING 2.7X14 (Screw) ×4 IMPLANT
SCREW LOCKING 2.7X16 (Screw) ×4 IMPLANT
SCREW NLOCK CORT 2.7X16 NS (Screw) IMPLANT
SET HNDPC FAN SPRY TIP SCT (DISPOSABLE) IMPLANT
STOCKINETTE IMPERVIOUS 9X36 MD (GAUZE/BANDAGES/DRESSINGS) ×1 IMPLANT
SUCTION FRAZIER HANDLE 10FR (MISCELLANEOUS) ×2
SUCTION TUBE FRAZIER 10FR DISP (MISCELLANEOUS) ×1 IMPLANT
SUT ETHILON 3 0 PS 1 (SUTURE) ×5 IMPLANT
SUT MNCRL AB 3-0 PS2 18 (SUTURE) IMPLANT
SUT VIC AB 0 CT1 36 (SUTURE) ×2 IMPLANT
SUT VIC AB 2-0 CTB1 (SUTURE) ×4 IMPLANT
SUT VIC AB 3-0 SH 27 (SUTURE) ×4
SUT VIC AB 3-0 SH 27X BRD (SUTURE) ×1 IMPLANT
SYR CONTROL 10ML LL (SYRINGE) ×2 IMPLANT
TOWEL GREEN STERILE (TOWEL DISPOSABLE) ×2 IMPLANT
TUBE CONNECTING 12X1/4 (SUCTIONS) ×2 IMPLANT
WATER STERILE IRR 1000ML POUR (IV SOLUTION) ×2 IMPLANT
YANKAUER SUCT BULB TIP NO VENT (SUCTIONS) ×1 IMPLANT

## 2019-06-15 NOTE — Progress Notes (Signed)
PT Cancellation Note  Patient Details Name: Greg Lee MRN: [id number] DOB: 04-Oct-1980   Cancelled Treatment:    Reason Eval/Treat Not Completed: Patient not medically ready(await ORIF and post op clearance)   [name] [name] 06/15/2019, 7:41 AM  [name], PT Acute Rehabilitation Services Pager: [phone] Office: [phone]

## 2019-06-15 NOTE — Anesthesia Preprocedure Evaluation (Addendum)
Anesthesia Evaluation  Patient identified by MRN, date of birth, ID band Patient awake    Reviewed: Allergy & Precautions, NPO status , Patient's Chart, lab work & pertinent test results  History of Anesthesia Complications Negative for: history of anesthetic complications  Airway Mallampati: II  TM Distance: >3 FB Neck ROM: Full    Dental  (+) Teeth Intact   Pulmonary neg pulmonary ROS, former smoker,    Pulmonary exam normal        Cardiovascular negative cardio ROS Normal cardiovascular exam     Neuro/Psych negative neurological ROS  negative psych ROS   GI/Hepatic negative GI ROS, Neg liver ROS,   Endo/Other  negative endocrine ROS  Renal/GU negative Renal ROS  negative genitourinary   Musculoskeletal negative musculoskeletal ROS (+)   Abdominal   Peds  Hematology negative hematology ROS (+)   Anesthesia Other Findings   Reproductive/Obstetrics                            Anesthesia Physical Anesthesia Plan  ASA: II  Anesthesia Plan: General   Post-op Pain Management: GA combined w/ Regional for post-op pain   Induction: Intravenous  PONV Risk Score and Plan: 2 and Ondansetron, Dexamethasone, Treatment may vary due to age or medical condition and Midazolam  Airway Management Planned: Oral ETT  Additional Equipment: None  Intra-op Plan:   Post-operative Plan: Extubation in OR  Informed Consent: I have reviewed the patients History and Physical, chart, labs and discussed the procedure including the risks, benefits and alternatives for the proposed anesthesia with the patient or authorized representative who has indicated his/her understanding and acceptance.     Dental advisory given  Plan Discussed with:   Anesthesia Plan Comments:        Anesthesia Quick Evaluation
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
## 2019-06-15 NOTE — Transfer of Care (Signed)
Immediate Anesthesia Transfer of Care Note  Patient: Greg Lee  Procedure(s) Performed: OPEN REDUCTION INTERNAL FIXATION (ORIF) ANKLE FRACTURE (Left Ankle)  Patient Location: PACU  Anesthesia Type:General  Level of Consciousness: awake, alert , oriented, patient cooperative and responds to stimulation  Airway & Oxygen Therapy: Patient Spontanous Breathing and Patient connected to face mask oxygen  Post-op Assessment: Report given to RN and Post -op Vital signs reviewed and stable  Post vital signs: Reviewed and stable  Last Vitals:  Vitals Value Taken Time  BP 123/67 06/15/19 1353  Temp    Pulse 79 06/15/19 1354  Resp 12 06/15/19 1354  SpO2 100 % 06/15/19 1354  Vitals shown include unvalidated device data.  Last Pain:  Vitals:   06/15/19 0920  TempSrc:   PainSc: 2          Complications: No apparent anesthesia complications

## 2019-06-15 NOTE — Anesthesia Procedure Notes (Addendum)
Anesthesia Regional Block: Adductor canal block   Pre-Anesthetic Checklist: ,, timeout performed, Correct Patient, Correct Site, Correct Laterality, Correct Procedure, Correct Position, site marked, Risks and benefits discussed,  Surgical consent,  Pre-op evaluation,  At surgeon's request and post-op pain management  Laterality: Left  Prep: chloraprep       Needles:  Injection technique: Single-shot  Needle Type: Echogenic Stimulator Needle     Needle Length: 9cm  Needle Gauge: 21     Additional Needles:   Procedures:,,,, ultrasound used (permanent image in chart),,,,  Narrative:  Start time: 06/15/2019 11:30 AM End time: 06/15/2019 11:33 AM Injection made incrementally with aspirations every 5 mL.  Performed by: Personally  Anesthesiologist: Lucretia Kern, MD  Additional Notes: Monitors applied. Injection made in 5cc increments. No resistance to injection. Good needle visualization. Patient tolerated procedure well.

## 2019-06-15 NOTE — Plan of Care (Signed)
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
## 2019-06-15 NOTE — Progress Notes (Signed)
Pt IV removed and AVS reviewed, all question answered to pt satisfaction. Pt belongings gathered and pt dressed. Pt used crutches in the room to ambulate to BR; pt is no longer due to void. PA spoke to this RN and said pt did not need to see PT/OT prior to d/c. Wife is in pt room and is driving pt to private residence. Will wheel down shortly

## 2019-06-15 NOTE — Anesthesia Procedure Notes (Addendum)
Anesthesia Regional Block: Popliteal block   Pre-Anesthetic Checklist: ,, timeout performed, Correct Patient, Correct Site, Correct Laterality, Correct Procedure, Correct Position, site marked, Risks and benefits discussed,  Surgical consent,  Pre-op evaluation,  At surgeon's request and post-op pain management  Laterality: Left  Prep: chloraprep       Needles:  Injection technique: Single-shot  Needle Type: Echogenic Stimulator Needle     Needle Length: 9cm  Needle Gauge: 21     Additional Needles:   Procedures:,,,, ultrasound used (permanent image in chart),,,,  Narrative:  Start time: 06/15/2019 11:33 AM End time: 06/15/2019 11:37 AM Injection made incrementally with aspirations every 5 mL.  Performed by: Personally  Anesthesiologist: Lucretia Kern, MD  Additional Notes: Monitors applied. Injection made in 5cc increments. No resistance to injection. Good needle visualization. Patient tolerated procedure well.

## 2019-06-15 NOTE — Brief Op Note (Signed)
    06/15/2019  1:54 PM  PATIENT:  Marylu Lund  39 y.o. male  PRE-OPERATIVE DIAGNOSIS:  Left Ankle trimalleolar fracture  POST-OPERATIVE DIAGNOSIS:  Left trimalleolar ankle Fracture  PROCEDURE:  Procedure(s): OPEN REDUCTION INTERNAL FIXATION (ORIF) ANKLE TRIMALLEOLAR FRACTURE with fixation of medial and lateral malleolus  SURGEON:  Surgeon(s): August Saucer, Corrie Mckusick, MD  ASSISTANT: luke magnant pa  ANESTHESIA:   general  EBL: 25 ml    Total I/O In: 700 [I.V.:600; IV Piggyback:100] Out: -   BLOOD ADMINISTERED: none  DRAINS: none   LOCAL MEDICATIONS USED:  none  SPECIMEN:  No Specimen  COUNTS:  YES  TOURNIQUET:  * No tourniquets in log *  DICTATION: .Other Dictation: Dictation Number [id number]  PLAN OF CARE: Discharge to home after PACU  PATIENT DISPOSITION:  PACU - hemodynamically stable

## 2019-06-15 NOTE — Anesthesia Postprocedure Evaluation (Signed)
Anesthesia Post Note  Patient: KINSTON MAGNAN  Procedure(s) Performed: OPEN REDUCTION INTERNAL FIXATION (ORIF) ANKLE FRACTURE (Left Ankle)     Patient location during evaluation: PACU Anesthesia Type: General Level of consciousness: awake and alert Pain management: pain level controlled Vital Signs Assessment: post-procedure vital signs reviewed and stable Respiratory status: spontaneous breathing, nonlabored ventilation and respiratory function stable Cardiovascular status: blood pressure returned to baseline and stable Postop Assessment: no apparent nausea or vomiting Anesthetic complications: no    Last Vitals:  Vitals:   06/15/19 1353 06/15/19 1417  BP: 123/67 119/61  Pulse: 80 73  Resp: 15 16  Temp: 36.7 C 36.9 C  SpO2: 100% 93%    Last Pain:  Vitals:   06/15/19 1417  TempSrc: Oral  PainSc:                  Lucretia Kern

## 2019-06-15 NOTE — Anesthesia Procedure Notes (Signed)
Procedure Name: Intubation Date/Time: 06/15/2019 11:51 AM Performed by: Glynda Jaeger, CRNA Pre-anesthesia Checklist: Patient identified, Patient being monitored, Timeout performed, Emergency Drugs available and Suction available Patient Re-evaluated:Patient Re-evaluated prior to induction Oxygen Delivery Method: Circle System Utilized Preoxygenation: Pre-oxygenation with 100% oxygen Induction Type: IV induction Ventilation: Mask ventilation without difficulty Laryngoscope Size: Mac and 4 Grade View: Grade I Tube type: Oral Tube size: 7.5 mm Number of attempts: 1 Airway Equipment and Method: Stylet Placement Confirmation: ETT inserted through vocal cords under direct vision,  positive ETCO2 and breath sounds checked- equal and bilateral Secured at: 21 cm Tube secured with: Tape Dental Injury: Teeth and Oropharynx as per pre-operative assessment

## 2019-06-16 ENCOUNTER — Encounter: Payer: Self-pay | Admitting: *Deleted
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
## 2019-06-17 ENCOUNTER — Telehealth: Payer: Self-pay | Admitting: Orthopedic Surgery

## 2019-06-17 NOTE — Telephone Encounter (Signed)
Pls advise. Thanks.  

## 2019-06-17 NOTE — Telephone Encounter (Signed)
IC s/w patient he stated he does not need order at this time that he thinks his wife has already gotten him one. He will call me back if anything changes.

## 2019-06-17 NOTE — Telephone Encounter (Signed)
Okay for knee scooter
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
## 2019-06-23 ENCOUNTER — Ambulatory Visit: Payer: 59 | Admitting: Orthopedic Surgery

## 2019-06-23 ENCOUNTER — Other Ambulatory Visit: Payer: Self-pay

## 2019-06-23 ENCOUNTER — Ambulatory Visit (INDEPENDENT_AMBULATORY_CARE_PROVIDER_SITE_OTHER): Payer: 59

## 2019-06-23 ENCOUNTER — Encounter: Payer: Self-pay | Admitting: Orthopedic Surgery

## 2019-06-23 VITALS — Ht 69.0 in | Wt 206.0 lb

## 2019-06-23 DIAGNOSIS — M25572 Pain in left ankle and joints of left foot: Secondary | ICD-10-CM
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
## 2019-06-30 ENCOUNTER — Other Ambulatory Visit: Payer: Self-pay

## 2019-06-30 ENCOUNTER — Ambulatory Visit (INDEPENDENT_AMBULATORY_CARE_PROVIDER_SITE_OTHER): Payer: 59 | Admitting: Orthopedic Surgery

## 2019-06-30 ENCOUNTER — Encounter: Payer: Self-pay | Admitting: Orthopedic Surgery

## 2019-06-30 ENCOUNTER — Other Ambulatory Visit (INDEPENDENT_AMBULATORY_CARE_PROVIDER_SITE_OTHER): Payer: Self-pay

## 2019-06-30 VITALS — Ht 69.0 in | Wt 206.0 lb

## 2019-06-30 DIAGNOSIS — S82842D Displaced bimalleolar fracture of left lower leg, subsequent encounter for closed fracture with routine healing: Secondary | ICD-10-CM

## 2019-07-02 ENCOUNTER — Encounter: Payer: Self-pay | Admitting: Orthopedic Surgery
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
## 2019-07-14 ENCOUNTER — Other Ambulatory Visit: Payer: Self-pay

## 2019-07-14 ENCOUNTER — Ambulatory Visit (INDEPENDENT_AMBULATORY_CARE_PROVIDER_SITE_OTHER): Payer: 59 | Admitting: Orthopedic Surgery

## 2019-07-14 ENCOUNTER — Ambulatory Visit (INDEPENDENT_AMBULATORY_CARE_PROVIDER_SITE_OTHER): Payer: 59

## 2019-07-14 DIAGNOSIS — S82842D Displaced bimalleolar fracture of left lower leg, subsequent encounter for closed fracture with routine healing: Secondary | ICD-10-CM

## 2019-07-15 ENCOUNTER — Encounter: Payer: Self-pay | Admitting: Orthopedic Surgery
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
## 2019-07-21 ENCOUNTER — Encounter: Payer: Self-pay | Admitting: Orthopedic Surgery

## 2019-07-21 NOTE — Telephone Encounter (Signed)
No problem to use tens machine pls claal thx

## 2019-08-06 ENCOUNTER — Encounter: Payer: Self-pay | Admitting: Orthopedic Surgery

## 2019-08-06 ENCOUNTER — Ambulatory Visit (INDEPENDENT_AMBULATORY_CARE_PROVIDER_SITE_OTHER): Payer: 59 | Admitting: Orthopedic Surgery

## 2019-08-06 ENCOUNTER — Other Ambulatory Visit: Payer: Self-pay

## 2019-08-06 ENCOUNTER — Ambulatory Visit: Payer: Self-pay

## 2019-08-06 DIAGNOSIS — S82842D Displaced bimalleolar fracture of left lower leg, subsequent encounter for closed fracture with routine healing: Secondary | ICD-10-CM
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
## 2019-08-07 ENCOUNTER — Encounter: Payer: Self-pay | Admitting: Orthopedic Surgery

## 2019-09-15 ENCOUNTER — Ambulatory Visit (INDEPENDENT_AMBULATORY_CARE_PROVIDER_SITE_OTHER): Payer: 59 | Admitting: Orthopedic Surgery

## 2019-09-15 DIAGNOSIS — S82842D Displaced bimalleolar fracture of left lower leg, subsequent encounter for closed fracture with routine healing: Secondary | ICD-10-CM

## 2019-09-18 ENCOUNTER — Encounter: Payer: Self-pay | Admitting: Orthopedic Surgery

## 2019-09-18 NOTE — Progress Notes (Signed)
   Post-Op Visit Note   Patient: Greg Lee           Date of Birth: 06-12-1980           MRN: [id number] Visit Date: 09/15/2019 PCP: Patient, No Pcp Per   Assessment & Plan:  Chief Complaint:  Chief Complaint  Patient presents with  . Left Ankle - Follow-up   Visit Diagnoses: No diagnosis found.  Plan: Patient is a 39 year old male presents s/p left ankle ORIF on 06/15/2019.  He is doing well overall with occasional swelling and anterior ankle soreness.  This is continue to improve week by week.  He takes ibuprofen on occasion for swelling.  He does not really have much pain.  No pain with ambulation.  He does note that he has some issues when he tries to run as his ankle feels stiff.  His incisions have healed well.  He has no tenderness to palpation over the medial or lateral malleoli.  Syndesmosis is stable on exam.  He has been working for the past 2 to 3 weeks.  And he notes no significant problems.  He dorsiflexes to about 5 degrees past neutral.  Plan for patient to return to a home exercise program working on ankle range of motion, especially with dorsiflexion as this will make running feel easier.  Patient agreed with this plan.  Follow-up as needed.  Follow-Up Instructions: No follow-ups on file.   Orders:  No orders of the defined types were placed in this encounter.  No orders of the defined types were placed in this encounter.   Imaging: No results found.  PMFS History: Patient Active Problem List   Diagnosis Date Noted  . Ankle fracture, left 06/14/2019  . Pilonidal cyst 04/30/2011   Past Medical History:  Diagnosis Date  . Pilonidal cyst     Family History  Problem Relation Age of Onset  . Hypertension Mother   . Hypertension Father   . Diabetes Father   . Cancer Maternal Grandmother        colon  . Heart disease Maternal Grandfather     Past Surgical History:  Procedure Laterality Date  . ADENOIDECTOMY    . ORIF ANKLE FRACTURE Left 06/15/2019    Procedure: OPEN REDUCTION INTERNAL FIXATION (ORIF) ANKLE FRACTURE;  Surgeon: Cammy Copa, MD;  Location: [facility] OR;  Service: Orthopedics;  Laterality: Left;  . TYMPANOSTOMY TUBE PLACEMENT    . WISDOM TOOTH EXTRACTION     Social History   Occupational History  . Not on file  Tobacco Use  . Smoking status: Former Smoker    Packs/day: 0.00  . Smokeless tobacco: Never Used  Vaping Use  . Vaping Use: Never used  Substance and Sexual Activity  . Alcohol use: Yes    Comment: 1 q wk  . Drug use: No  . Sexual activity: Not on file
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
## 2020-10-03 IMAGING — DX DG ANKLE PORT 2V*L*
2 series · 2 of 2 positions shown · non-contrast
Comparison: 06/14/2019

CLINICAL DATA: Left ankle fracture

EXAM:
PORTABLE LEFT ANKLE - 2 VIEW

[ankle ap]
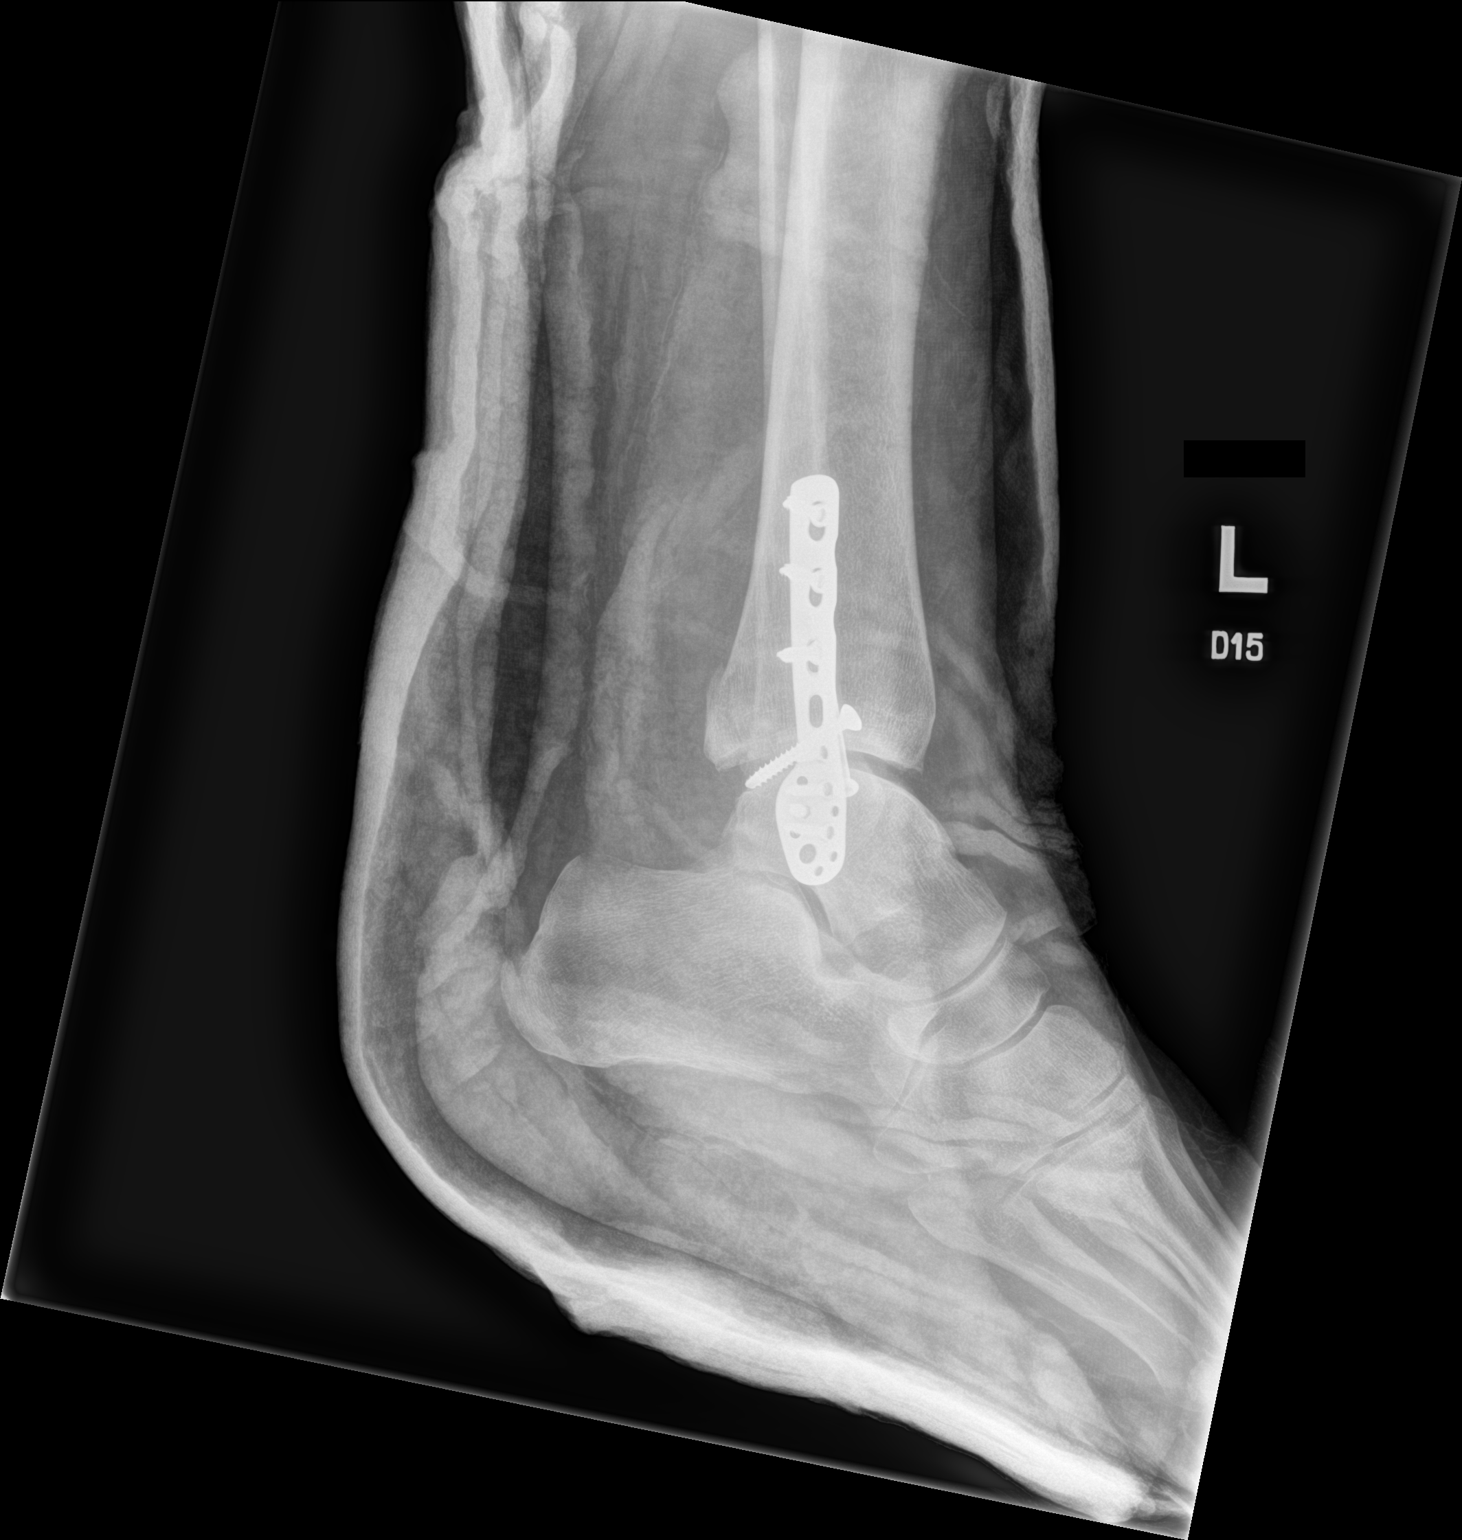

[ankle lat]
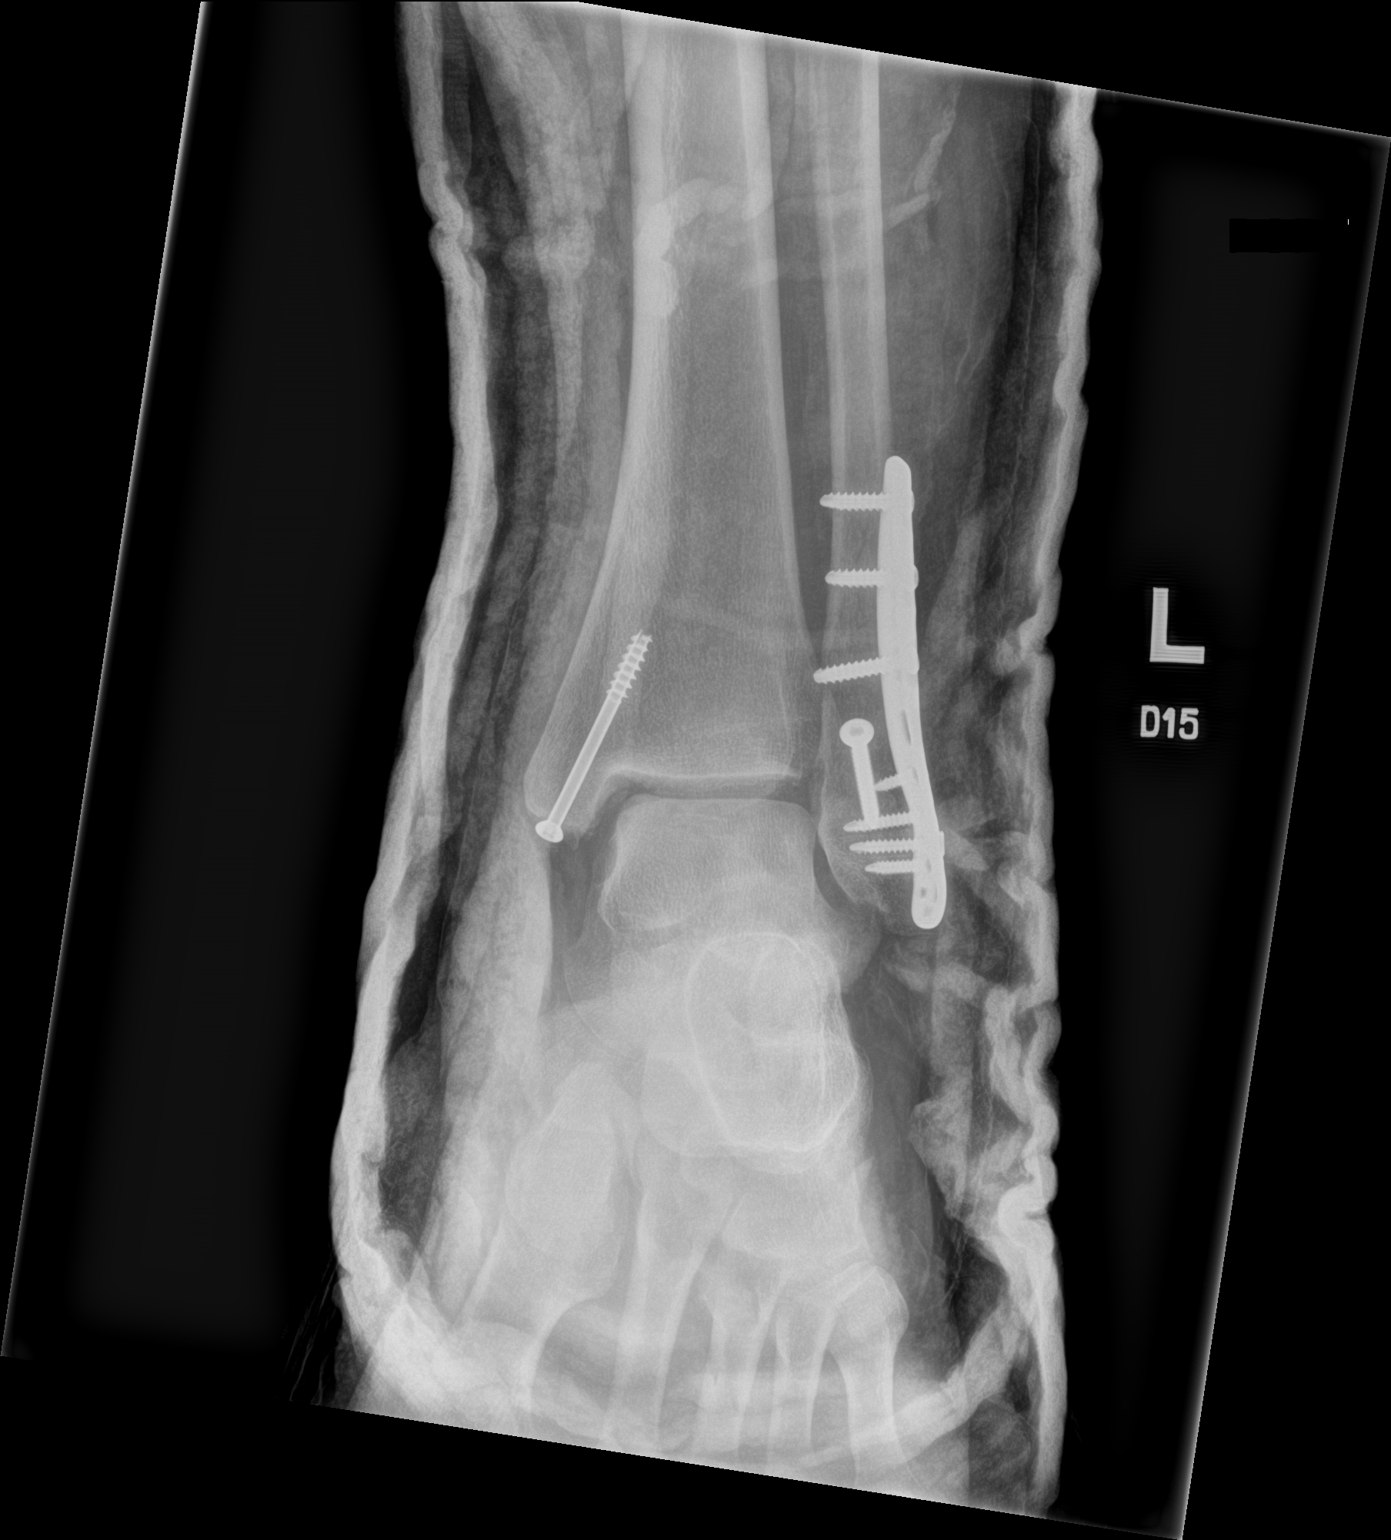

[2 of 2 positions shown; findings below may reference images not displayed]

FINDINGS: Frontal and lateral views of the left ankle are obtained. Casting
material obscures underlying bony detail. Plate and screw fixation
is been placed along the lateral malleolar fracture, with anatomic
alignment. There is a cannulated screw traversing the medial
malleolus. There is been reduction of the posterior malleolar
fracture, now in near anatomic alignment.

There is anatomic alignment of the tibiotalar joint. Diffuse soft
tissue edema.
IMPRESSION: 1. ORIF of the lateral and medial malleoli as above.
2. Near anatomic alignment of the posterior malleolar fracture
fragment.
3. Anatomic alignment of the tibiotalar joint.

## 2020-10-03 IMAGING — RF DG ANKLE COMPLETE 3+V*L*
1 series · 4 of 4 positions shown · non-contrast
Comparison: Left ankle radiographs 06/14/2019

FLUOROSCOPY TIME:  Fluoroscopy time:  30 seconds

Number of provided images: 4

CLINICAL DATA: Left ankle ORIF.

EXAM:
LEFT ANKLE COMPLETE - 3+ VIEW; DG C-ARM 1-60 MIN

[Series 1: run · 4 of 4 slices shown]
[im 1/4]
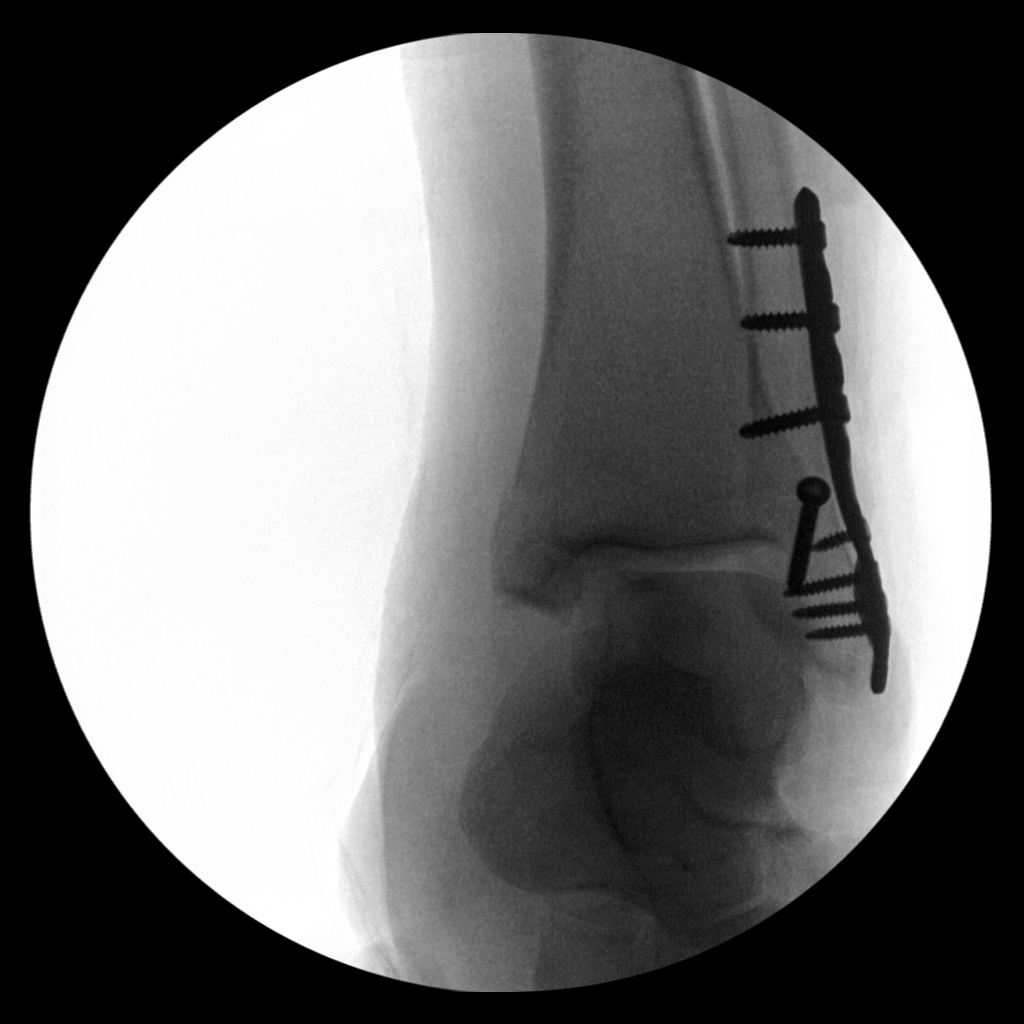
[im 2/4]
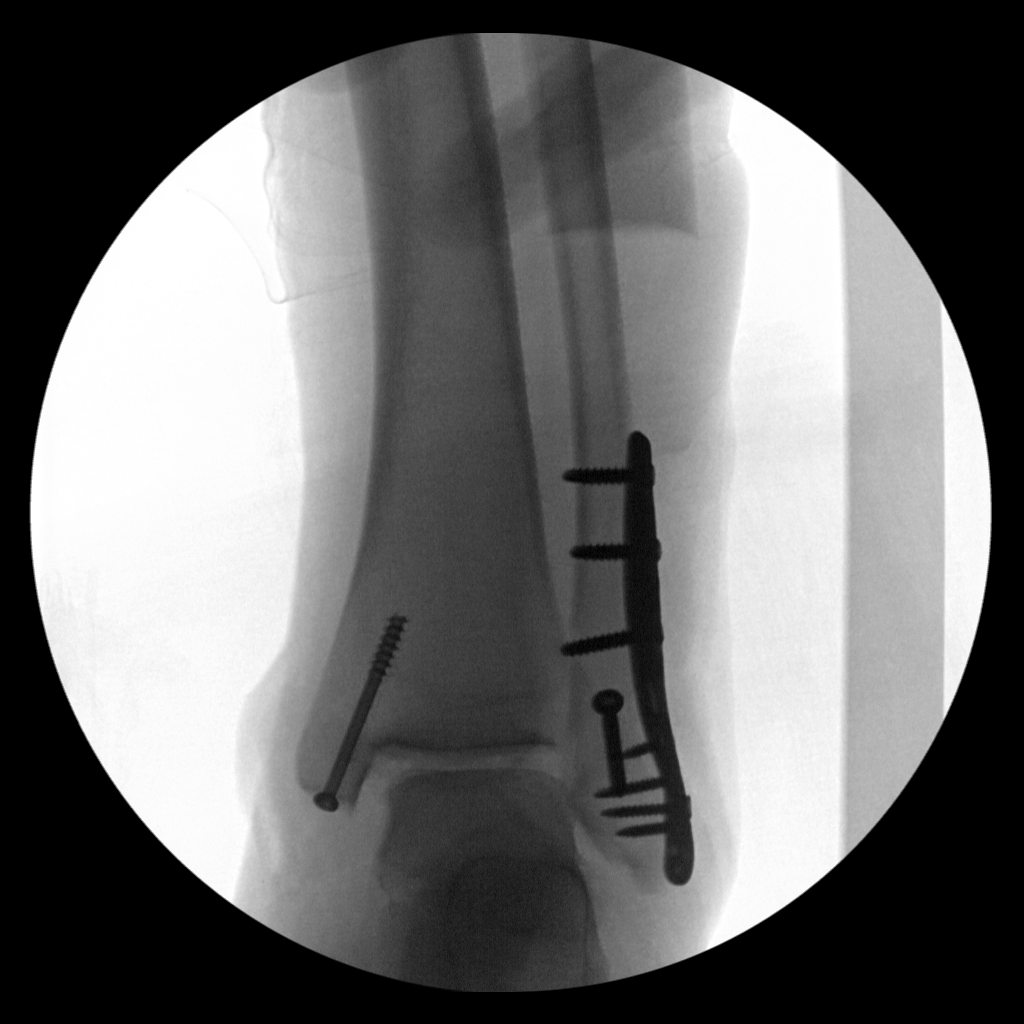
[im 3/4]
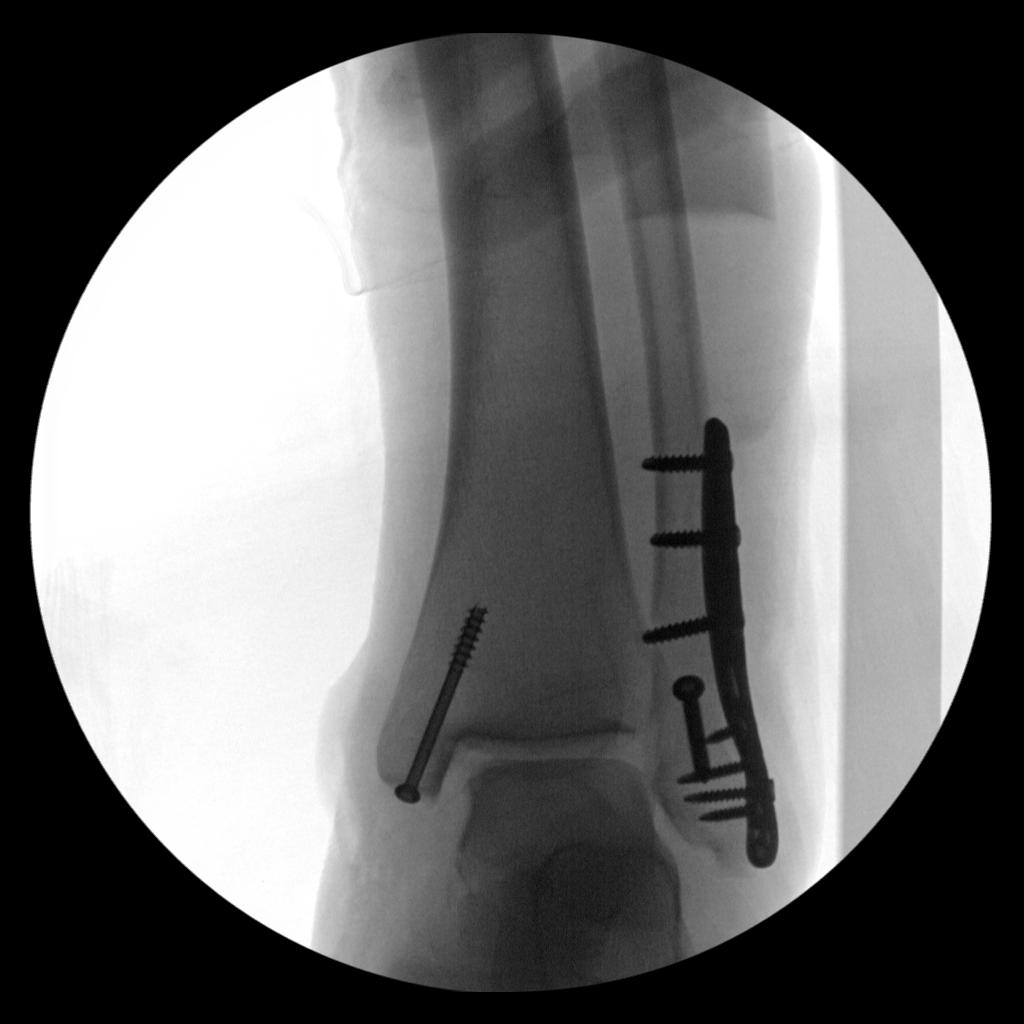
[im 4/4]
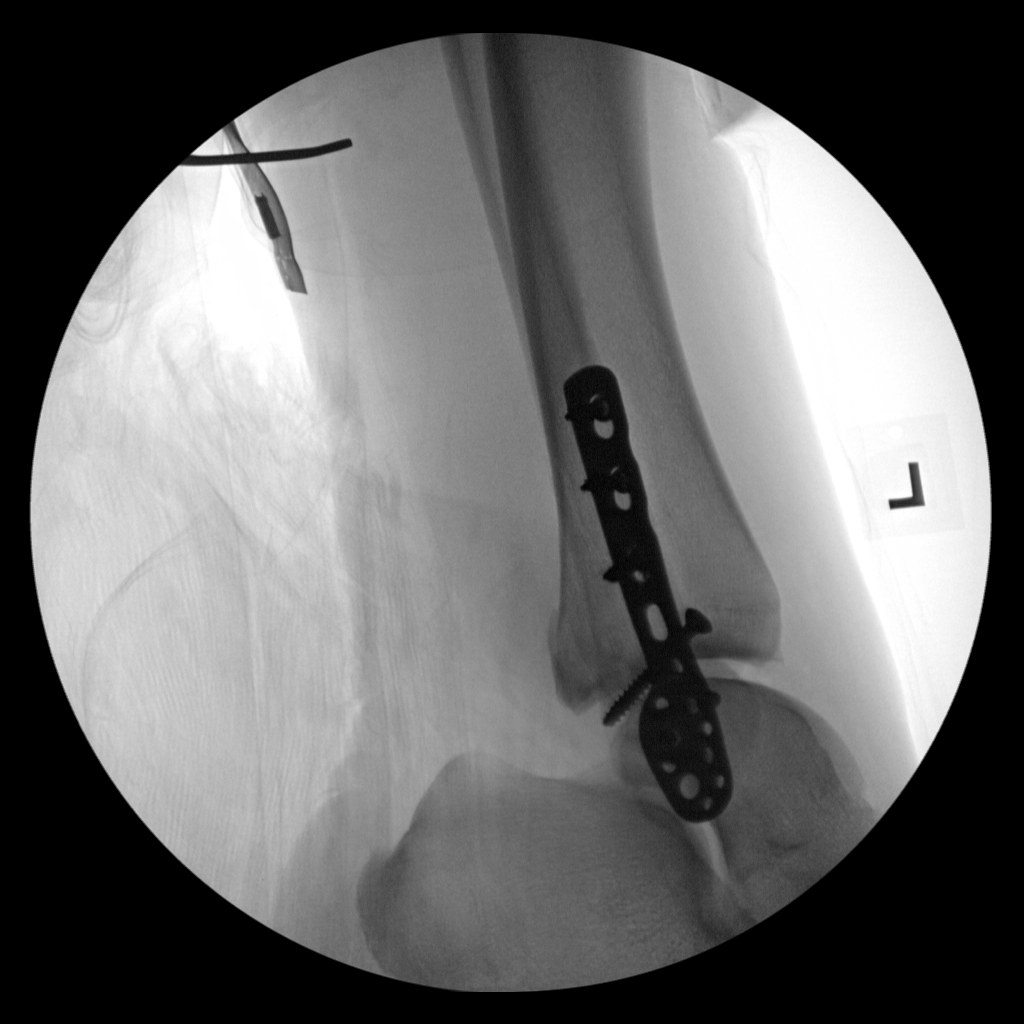

[4 of 4 positions shown; findings below may reference images not displayed]

FINDINGS: Four intraoperative spot fluoroscopic images of the ankle
demonstrate ORIF of the distal fibula fracture with placement of a
lateral plate and screws. There is an additional screw in the distal
fibula which is separate from the plate. Distal fibular alignment is
grossly anatomic. A screw has also been placed in the medial
malleolus. A known posterior malleolus fracture is again noted.
IMPRESSION: Intraoperative images as above.

## 2021-01-03 ENCOUNTER — Other Ambulatory Visit: Payer: Self-pay

## 2021-01-03 ENCOUNTER — Ambulatory Visit
Admission: RE | Admit: 2021-01-03 | Discharge: 2021-01-03 | Disposition: A | Discharge status: Home / Self Care | Payer: 59 | Source: Ambulatory Visit | Attending: Emergency Medicine | Admitting: Emergency Medicine

## 2021-01-03 VITALS — BP 137/93 | HR 95 | Temp 99.4°F | Resp 18

## 2021-01-03 DIAGNOSIS — R030 Elevated blood-pressure reading, without diagnosis of hypertension: Secondary | ICD-10-CM

## 2021-01-03 DIAGNOSIS — J069 Acute upper respiratory infection, unspecified: Secondary | ICD-10-CM | POA: Diagnosis not present

## 2021-01-03 DIAGNOSIS — H6692 Otitis media, unspecified, left ear: Secondary | ICD-10-CM

## 2021-01-03 MED ORDER — AMOXICILLIN 875 MG PO TABS: 875.0000 mg | ORAL_TABLET | Freq: Two times a day (BID) | ORAL | 0 refills | Status: AC

## 2021-01-03 NOTE — Discharge Instructions (Addendum)
Take the amoxicillin as directed.  Follow up with your primary care provider if your symptoms are not improving.    Your blood pressure is elevated today at 161/87; repeat 137/93.  Please have this rechecked by your primary care provider in 2-4 weeks.

## 2021-01-03 NOTE — ED Triage Notes (Signed)
Patient presents to Urgent Care with complaints of sore throat x 1 week. Ear pain x 2 days Treating symptoms with cough drops, ibuprofen, alka seltzer, and cold/flu meds.  Denies fever.

## 2021-01-03 NOTE — ED Provider Notes (Signed)
Renaldo Fiddler    CSN: [id number] Arrival date & time: 01/03/21  1657      History   Chief Complaint Chief Complaint  Patient presents with   Sore Throat   Otalgia    HPI Greg Lee is a 40 y.o. male.  Patient presents with a 1 week history of sore throat and congestion.  He reports left ear pain for the past 2 days.  He denies fever, chills, rash, ear drainage, change in hearing, cough, shortness of breath, or other symptoms.  Treatment attempted at home with OTC sinus medication.  The history is provided by the patient.   Past Medical History:  Diagnosis Date   Pilonidal cyst     Patient Active Problem List   Diagnosis Date Noted   Ankle fracture, left 06/14/2019   Pilonidal cyst 04/30/2011    Past Surgical History:  Procedure Laterality Date   ADENOIDECTOMY     ORIF ANKLE FRACTURE Left 06/15/2019   Procedure: OPEN REDUCTION INTERNAL FIXATION (ORIF) ANKLE FRACTURE;  Surgeon: Cammy Copa, MD;  Location: [facility] OR;  Service: Orthopedics;  Laterality: Left;   TYMPANOSTOMY TUBE PLACEMENT     WISDOM TOOTH EXTRACTION         Home Medications    Prior to Admission medications   Medication Sig Start Date End Date Taking? Authorizing Provider  amoxicillin (AMOXIL) 875 MG tablet Take 1 tablet (875 mg total) by mouth 2 (two) times daily for 7 days. 01/03/21 01/10/21 Yes Mickie Bail, NP  ibuprofen (ADVIL) 200 MG tablet Take 600-800 mg by mouth daily as needed (pain).    [provider]  methocarbamol (ROBAXIN) 500 MG tablet Take 1 tablet (500 mg total) by mouth every 8 (eight) hours as needed for muscle spasms. 06/15/19   Magnant, Joycie Peek, PA-C    Family History Family History  Problem Relation Age of Onset   Hypertension Mother    Hypertension Father    Diabetes Father    Cancer Maternal Grandmother        colon   Heart disease Maternal Grandfather     Social History Social History   Tobacco Use   Smoking status: Former     Packs/day: 0.00    Types: Cigarettes   Smokeless tobacco: Never  Vaping Use   Vaping Use: Never used  Substance Use Topics   Alcohol use: Yes    Comment: 1 q wk   Drug use: No     Allergies   Patient has no known allergies.   Review of Systems Review of Systems  Constitutional:  Negative for chills and fever.  HENT:  Positive for congestion, ear pain and sore throat. Negative for ear discharge and hearing loss.   Respiratory:  Negative for cough and shortness of breath.   Cardiovascular:  Negative for chest pain and palpitations.  Gastrointestinal:  Negative for abdominal pain, diarrhea and vomiting.  Skin:  Negative for color change and rash.  All other systems reviewed and are negative.   Physical Exam Triage Vital Signs ED Triage Vitals  Enc Vitals Group     BP      Pulse      Resp      Temp      Temp src      SpO2      Weight      Height      Head Circumference      Peak Flow      Pain Score  Pain Loc      Pain Edu?      Excl. in GC?    No data found.  Updated Vital Signs BP (!) 137/93 (BP Location: Left Arm)   Pulse 95   Temp 99.4 F (37.4 C) (Oral)   Resp 18   SpO2 95%   Visual Acuity Right Eye Distance:   Left Eye Distance:   Bilateral Distance:    Right Eye Near:   Left Eye Near:    Bilateral Near:     Physical Exam Vitals and nursing note reviewed.  Constitutional:      General: He is not in acute distress.    Appearance: He is well-developed.  HENT:     Head: Normocephalic and atraumatic.     Right Ear: Ear canal normal. Tympanic membrane is erythematous.     Left Ear: Tympanic membrane and ear canal normal.     Nose: Nose normal.     Mouth/Throat:     Mouth: Mucous membranes are moist.     Pharynx: Oropharynx is clear.  Eyes:     Conjunctiva/sclera: Conjunctivae normal.  Cardiovascular:     Rate and Rhythm: Normal rate and regular rhythm.     Heart sounds: Normal heart sounds.  Pulmonary:     Effort: Pulmonary effort  is normal. No respiratory distress.     Breath sounds: Normal breath sounds.  Abdominal:     Palpations: Abdomen is soft.     Tenderness: There is no abdominal tenderness.  Musculoskeletal:     Cervical back: Neck supple.  Skin:    General: Skin is warm and dry.  Neurological:     General: No focal deficit present.     Mental Status: He is alert and oriented to person, place, and time.  Psychiatric:        Mood and Affect: Mood normal.        Behavior: Behavior normal.     UC Treatments / Results  Labs (all labs ordered are listed, but only abnormal results are displayed) Labs Reviewed - No data to display  EKG   Radiology No results found.  Procedures Procedures (including critical care time)  Medications Ordered in UC Medications - No data to display  Initial Impression / Assessment and Plan / UC Course  I have reviewed the triage vital signs and the nursing notes.  Pertinent labs & imaging results that were available during my care of the patient were reviewed by me and considered in my medical decision making (see chart for details).  Left otitis media, viral URI, elevated blood pressure reading.  Treating with amoxicillin.  Discussed symptomatic treatment including Tylenol or ibuprofen.  Discussed with patient that he should avoid over-the-counter sinus and cold medications that elevate his blood pressure.  Discussed that his blood pressure is elevated today and needs to be rechecked by his PCP in 2 to 4 weeks.  He agrees to plan of care.   Final Clinical Impressions(s) / UC Diagnoses   Final diagnoses:  Left otitis media, unspecified otitis media type  Viral upper respiratory tract infection  Elevated blood pressure reading     Discharge Instructions      Take the amoxicillin as directed.  Follow up with your primary care provider if your symptoms are not improving.    Your blood pressure is elevated today at 161/87; repeat 137/93.  Please have this  rechecked by your primary care provider in 2-4 weeks.  ED Prescriptions     Medication Sig Dispense Auth. Provider   amoxicillin (AMOXIL) 875 MG tablet Take 1 tablet (875 mg total) by mouth 2 (two) times daily for 7 days. 14 tablet Mickie Bail, NP      PDMP not reviewed this encounter.   Mickie Bail, NP 01/03/21 1740
